# Patient Record
Sex: Female | Born: 1995 | Race: Black or African American | Hispanic: No | Marital: Single
Health system: Southern US, Community
[De-identification: ages and names within clinical notes are randomized; demographics above are authoritative.]

---

## 2017-02-15 ENCOUNTER — Encounter (HOSPITAL_COMMUNITY): Payer: Self-pay | Admitting: Emergency Medicine

## 2017-02-15 ENCOUNTER — Emergency Department (HOSPITAL_COMMUNITY)
Admission: EM | Admit: 2017-02-15 | Discharge: 2017-02-15 | Disposition: A | Discharge status: Home / Self Care | Payer: 59 | Attending: Emergency Medicine | Admitting: Emergency Medicine

## 2017-02-15 DIAGNOSIS — R6889 Other general symptoms and signs: Secondary | ICD-10-CM

## 2017-02-15 DIAGNOSIS — J111 Influenza due to unidentified influenza virus with other respiratory manifestations: Secondary | ICD-10-CM | POA: Diagnosis not present

## 2017-02-15 DIAGNOSIS — R509 Fever, unspecified: Secondary | ICD-10-CM | POA: Diagnosis present

## 2017-02-15 MED ORDER — ACETAMINOPHEN 325 MG PO TABS
650.0000 mg | ORAL_TABLET | Freq: Once | ORAL | Status: AC | PRN
  Administered 2017-02-15: 650 mg via ORAL
  Filled 2017-02-15: qty 2

## 2017-02-15 MED ORDER — IBUPROFEN 200 MG PO TABS
600.0000 mg | ORAL_TABLET | Freq: Once | ORAL | Status: AC
  Administered 2017-02-15: 600 mg via ORAL
  Filled 2017-02-15: qty 3

## 2017-02-15 NOTE — ED Triage Notes (Signed)
Per pt, states fever, chills since last night-has not taken any meds

## 2017-02-15 NOTE — ED Provider Notes (Signed)
[facility]-EMERGENCY DEPT Provider Note   CSN: [id number] Arrival date & time: 02/15/17  0908     History   Chief Complaint Chief Complaint  Patient presents with  . Fever    HPI Christy Peterson is a 21 y.o. female.  HPI   21 year old female presenting with what she describes as "flu" symptoms.  Onset yesterday.  Subjective fever.  Myalgias.  Headache.  Cough.  No sore throat.  No urinary complaints.  No vomiting or diarrhea.  No sick contacts.  She is otherwise healthy.  She has been taking Tylenol with mild improvement.  History reviewed. No pertinent past medical history.  There are no active problems to display for this patient.   History reviewed. No pertinent surgical history.  OB History    No data available       Home Medications    Prior to Admission medications   Not on File    Family History No family history on file.  Social History Social History   Tobacco Use  . Smoking status: Not on file  Substance Use Topics  . Alcohol use: No    Frequency: Never  . Drug use: No     Allergies   Patient has no allergy information on record.   Review of Systems Review of Systems  All systems reviewed and negative, other than as noted in HPI.  Physical Exam Updated Vital Signs BP 114/67 (BP Location: Right Arm)   Pulse 85   Temp (!) 101.2 F (38.4 C) (Oral)   Resp 16   LMP 01/12/2017   SpO2 98%   Physical Exam  Constitutional: She appears well-developed and well-nourished. No distress.  Laying in bed.  Somewhat tired appearing, but nontoxic.  HENT:  Head: Normocephalic and atraumatic.  Mouth/Throat: No oropharyngeal exudate.  TMs clear b/l.   Eyes: Conjunctivae are normal. Right eye exhibits no discharge. Left eye exhibits no discharge.  Neck: Neck supple.  No nuchal rigidity  Cardiovascular: Normal rate, regular rhythm and normal heart sounds. Exam reveals no gallop and no friction rub.  No murmur  heard. Pulmonary/Chest: Effort normal and breath sounds normal. No respiratory distress.  Abdominal: Soft. She exhibits no distension. There is no tenderness.  Musculoskeletal: She exhibits no edema or tenderness.  Neurological: She is alert.  Skin: Skin is warm and dry.  Psychiatric: She has a normal mood and affect. Her behavior is normal. Thought content normal.  Nursing note and vitals reviewed.    ED Treatments / Results  Labs (all labs ordered are listed, but only abnormal results are displayed) Labs Reviewed - No data to display  EKG  EKG Interpretation None       Radiology No results found.  Procedures Procedures (including critical care time)  Medications Ordered in ED Medications  ibuprofen (ADVIL,MOTRIN) tablet 600 mg (not administered)  acetaminophen (TYLENOL) tablet 650 mg (650 mg Oral Given 02/15/17 0929)     Initial Impression / Assessment and Plan / ED Course  I have reviewed the triage vital signs and the nursing notes.  Pertinent labs & imaging results that were available during my care of the patient were reviewed by me and considered in my medical decision making (see chart for details).     21 year old female with flulike symptoms.  Her exam is nonfocal.  She is nontoxic.  No clear indication to direct further testing at this time.  She is 20 and otherwise healthy.  Symptomatic treatment.  Return precautions were discussed.  Work note provided.  Final Clinical Impressions(s) / ED Diagnoses   Final diagnoses:  Flu-like symptoms    ED Discharge Orders    None       Raeford RazorKohut, Mico Spark, MD 02/15/17 [phone]

## 2019-11-19 ENCOUNTER — Emergency Department (HOSPITAL_COMMUNITY): Payer: No Typology Code available for payment source

## 2019-11-19 ENCOUNTER — Emergency Department (HOSPITAL_COMMUNITY)
Admission: EM | Admit: 2019-11-19 | Discharge: 2019-11-19 | Disposition: A | Discharge status: Home / Self Care | Payer: No Typology Code available for payment source | Attending: Emergency Medicine | Admitting: Emergency Medicine

## 2019-11-19 ENCOUNTER — Other Ambulatory Visit: Payer: Self-pay

## 2019-11-19 DIAGNOSIS — Y999 Unspecified external cause status: Secondary | ICD-10-CM | POA: Diagnosis not present

## 2019-11-19 DIAGNOSIS — S43401A Unspecified sprain of right shoulder joint, initial encounter: Secondary | ICD-10-CM

## 2019-11-19 DIAGNOSIS — Y939 Activity, unspecified: Secondary | ICD-10-CM | POA: Insufficient documentation

## 2019-11-19 DIAGNOSIS — W010XXA Fall on same level from slipping, tripping and stumbling without subsequent striking against object, initial encounter: Secondary | ICD-10-CM | POA: Insufficient documentation

## 2019-11-19 DIAGNOSIS — Y9289 Other specified places as the place of occurrence of the external cause: Secondary | ICD-10-CM | POA: Diagnosis not present

## 2019-11-19 DIAGNOSIS — S4991XA Unspecified injury of right shoulder and upper arm, initial encounter: Secondary | ICD-10-CM | POA: Insufficient documentation

## 2019-11-19 MED ORDER — TRAMADOL HCL 50 MG PO TABS: 50.0000 mg | ORAL_TABLET | Freq: Four times a day (QID) | ORAL | 0 refills | Status: DC | PRN

## 2019-11-19 MED ORDER — IBUPROFEN 200 MG PO TABS
600.0000 mg | ORAL_TABLET | Freq: Once | ORAL | Status: AC
  Administered 2019-11-19: 03:00:00 600 mg via ORAL
  Filled 2019-11-19: qty 3

## 2019-11-19 NOTE — ED Provider Notes (Signed)
[facility]-EMERGENCY DEPT Provider Note   CSN: [id number] Arrival date & time: 11/19/19  8657     History Chief Complaint  Patient presents with  . Shoulder Injury    Christy Peterson is a 24 y.o. female.  Patient is a 24 year old female with no significant past medical history.  She presents for evaluation of her right shoulder injury.  She tells me she was startled by a bug, then lost her balance and fell.  She is uncertain as to how she injured her shoulder during the fall, but describes pain to the entire deltoid area.  She denies any numbness or tingling.  She denies any other injury.  Her pain is worse with movement and there are no alleviating factors.  The history is provided by the patient.       No past medical history on file.  There are no problems to display for this patient.   No past surgical history on file.   OB History   No obstetric history on file.     No family history on file.  Social History   Tobacco Use  . Smoking status: Not on file  Substance Use Topics  . Alcohol use: No  . Drug use: No    Home Medications Prior to Admission medications   Not on File    Allergies    Patient has no allergy information on record.  Review of Systems   Review of Systems  All other systems reviewed and are negative.   Physical Exam Updated Vital Signs BP 109/83 (BP Location: Left Arm)   Pulse (!) 59   Temp 98.2 F (36.8 C) (Oral)   Resp 16   Ht 5\' 6"  (1.676 m)   Wt 63.5 kg   LMP 11/17/2019   SpO2 100%   BMI 22.60 kg/m   Physical Exam Vitals and nursing note reviewed.  Constitutional:      General: She is not in acute distress.    Appearance: Normal appearance. She is not ill-appearing.  HENT:     Head: Normocephalic and atraumatic.  Pulmonary:     Effort: Pulmonary effort is normal.  Musculoskeletal:     Comments: The right shoulder appears grossly normal.  There is no deformity or significant swelling.  There  is tenderness over the deltoid.  She has pain with range of motion.  Ulnar and radial pulses are easily palpable and motor and sensation are intact throughout the entire hand.  Skin:    General: Skin is warm and dry.  Neurological:     Mental Status: She is alert and oriented to person, place, and time.     ED Results / Procedures / Treatments   Labs (all labs ordered are listed, but only abnormal results are displayed) Labs Reviewed  TYPE AND SCREEN    EKG None  Radiology DG Shoulder Right  Result Date: 11/19/2019 CLINICAL DATA:  Fall EXAM: RIGHT SHOULDER - 2+ VIEW COMPARISON:  None. FINDINGS: There is no evidence of fracture or dislocation. There is no evidence of arthropathy or other focal bone abnormality. Soft tissues are unremarkable. IMPRESSION: Negative. Electronically Signed   By: 11/21/2019 M.D.   On: 11/19/2019 01:33    Procedures Procedures (including critical care time)  Medications Ordered in ED Medications - No data to display  ED Course  I have reviewed the triage vital signs and the nursing notes.  Pertinent labs & imaging results that were available during my care of the patient  were reviewed by me and considered in my medical decision making (see chart for details).    MDM Rules/Calculators/A&P  X-rays are negative for fracture.  This appears to be a sprain of the shoulder.  She will be placed in an arm sling, treated with NSAIDs and tramadol.  She is to ice her shoulder and follow-up as needed if not improving in the next 1 to 2 weeks.  Final Clinical Impression(s) / ED Diagnoses Final diagnoses:  None    Rx / DC Orders ED Discharge Orders    None       Geoffery Lyons, MD 11/19/19 [phone]

## 2019-11-19 NOTE — ED Triage Notes (Signed)
Arrived POV from home. Patient reports she fell on right shoulder while running from a bug.

## 2019-11-19 NOTE — Discharge Instructions (Addendum)
Wear arm sling for the next several days.  Ice for 20 minutes every 2 hours while awake for the next 2 days.  Take ibuprofen 600 mg every 6 hours as needed for pain.    Follow-up with your primary doctor if not improving in the next week, and return to the ER if symptoms significantly worsen or change.

## 2021-04-29 IMAGING — CR DG SHOULDER 2+V*R*
3 series · 3 of 3 positions shown · non-contrast
Comparison: None.

CLINICAL DATA: Fall

EXAM:
RIGHT SHOULDER - 2+ VIEW

[w shoulder external right]
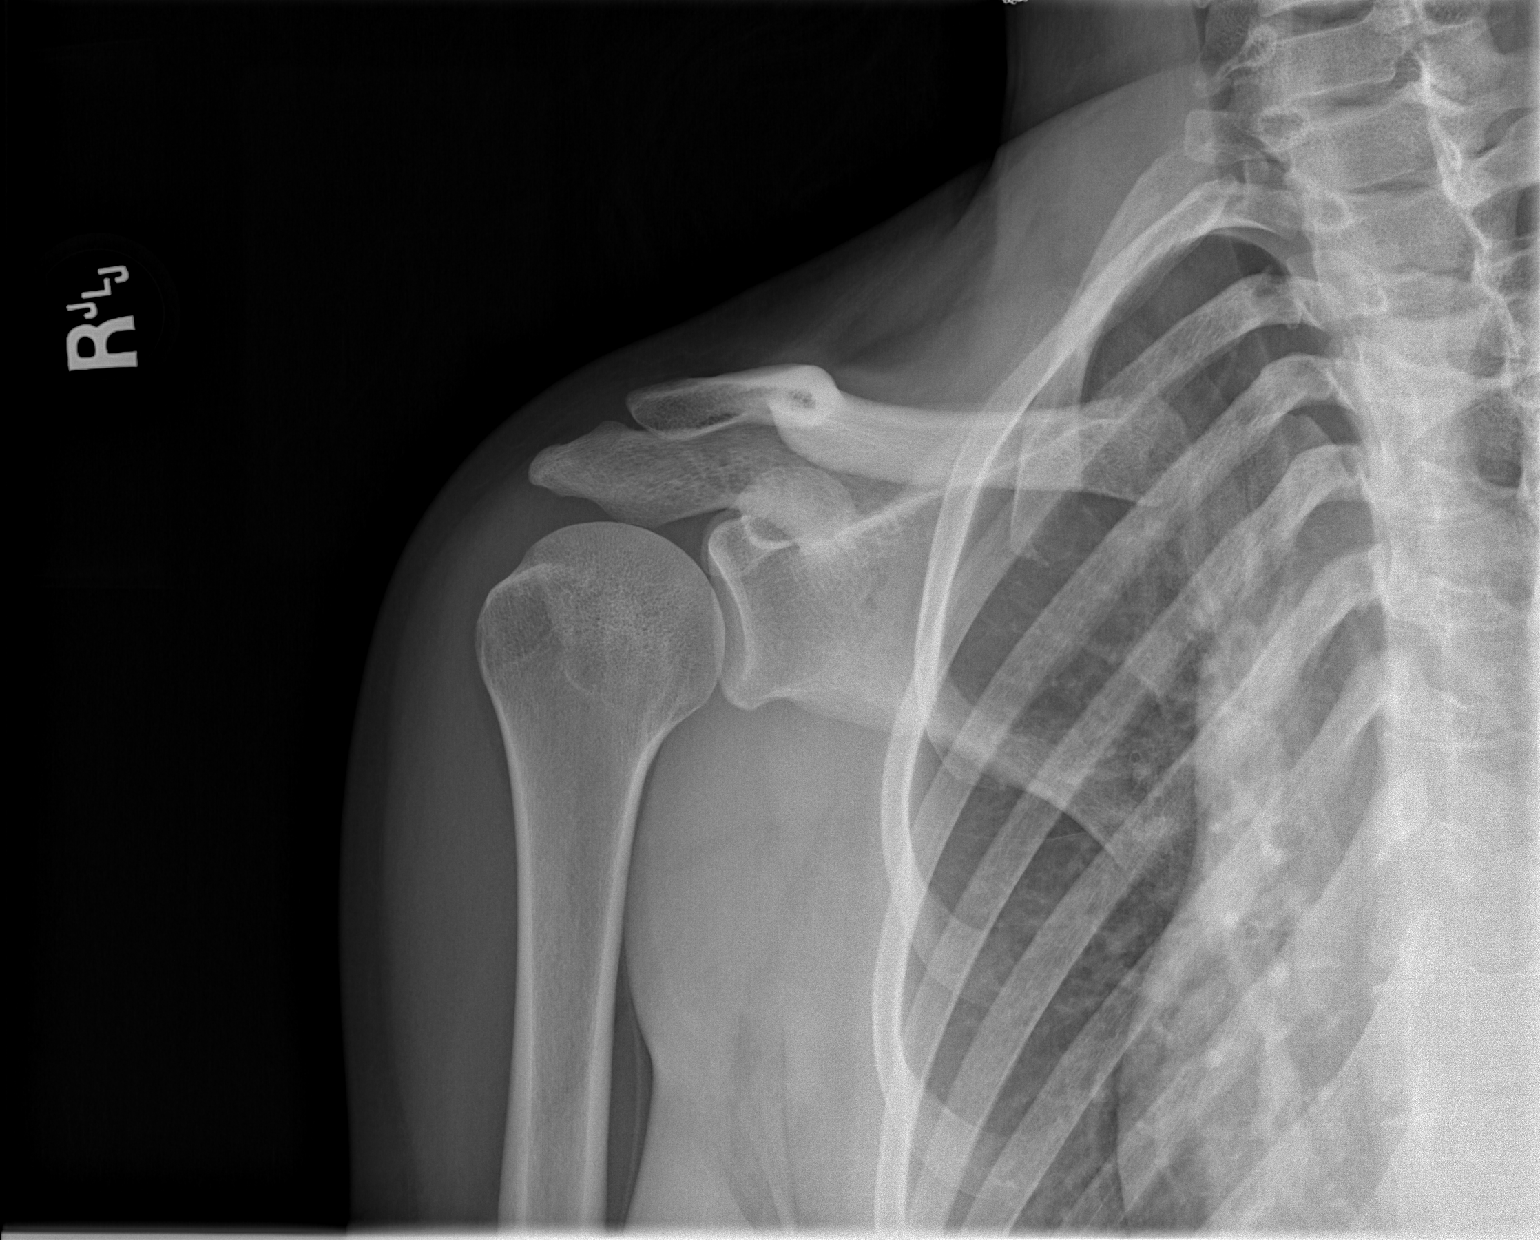

[w shoulder y-view right]
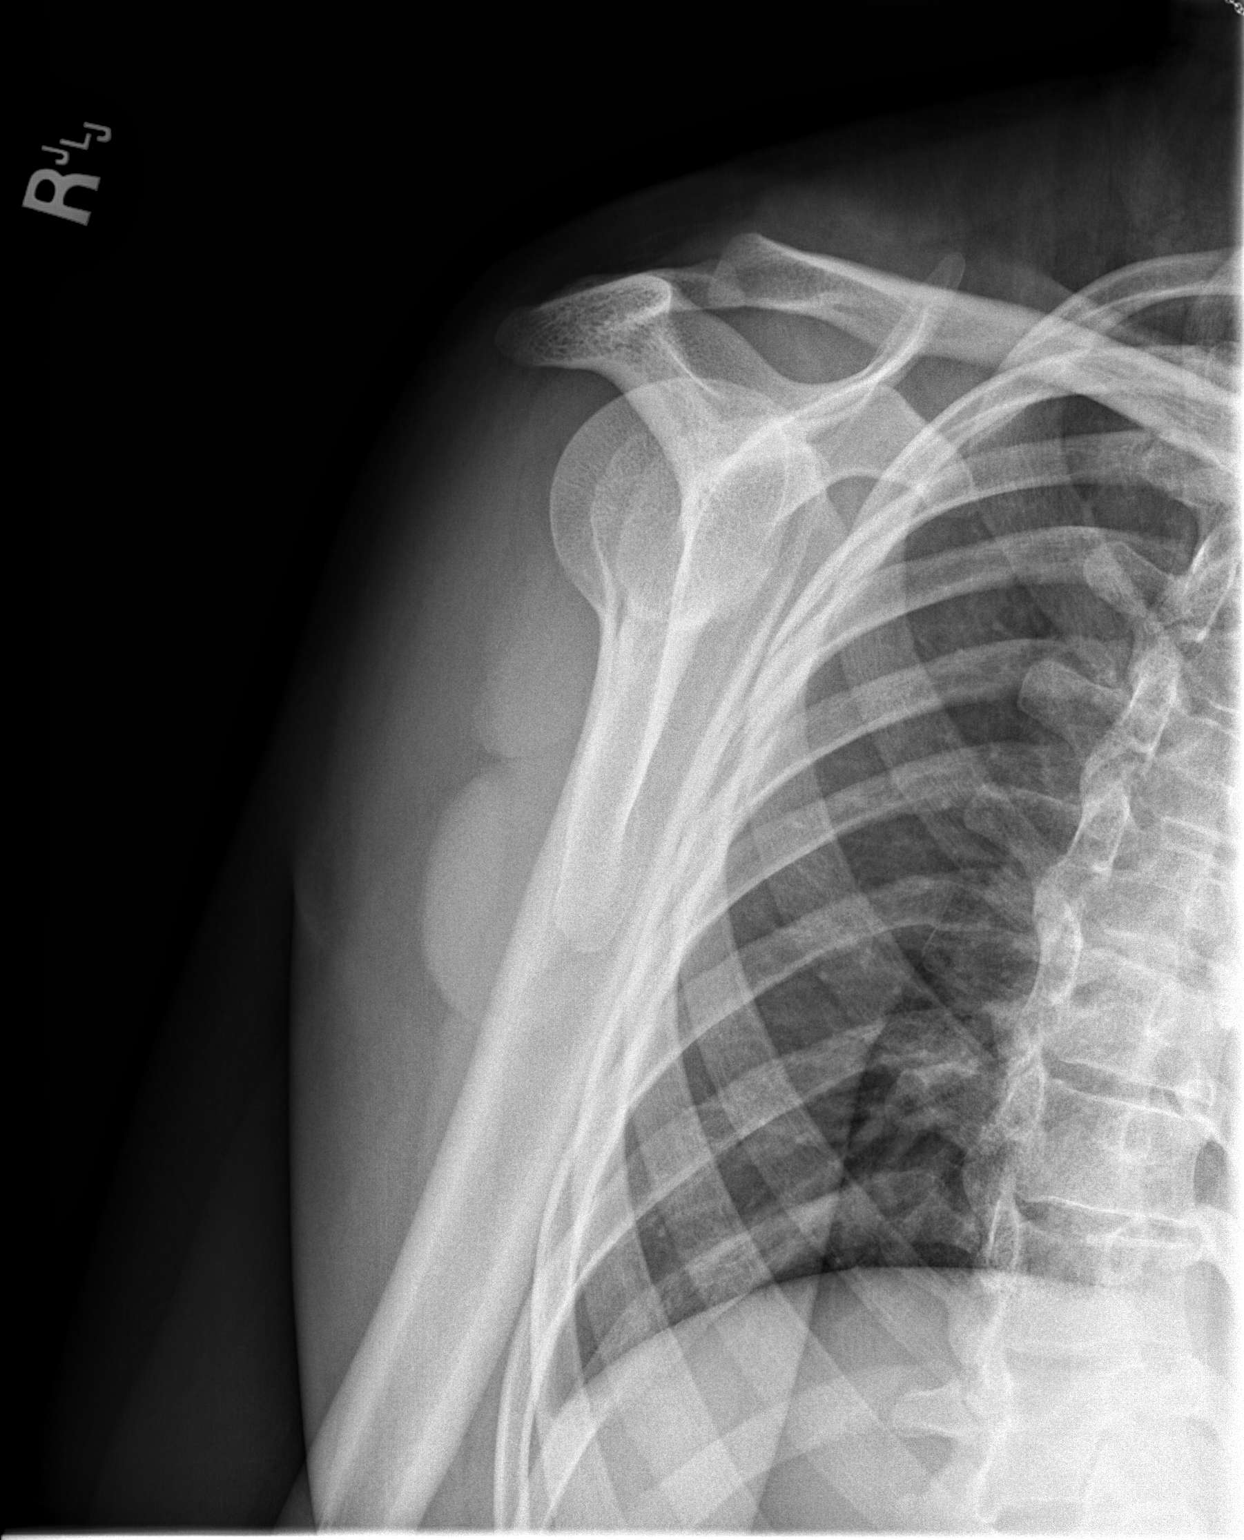

[x shoulder axillary right]
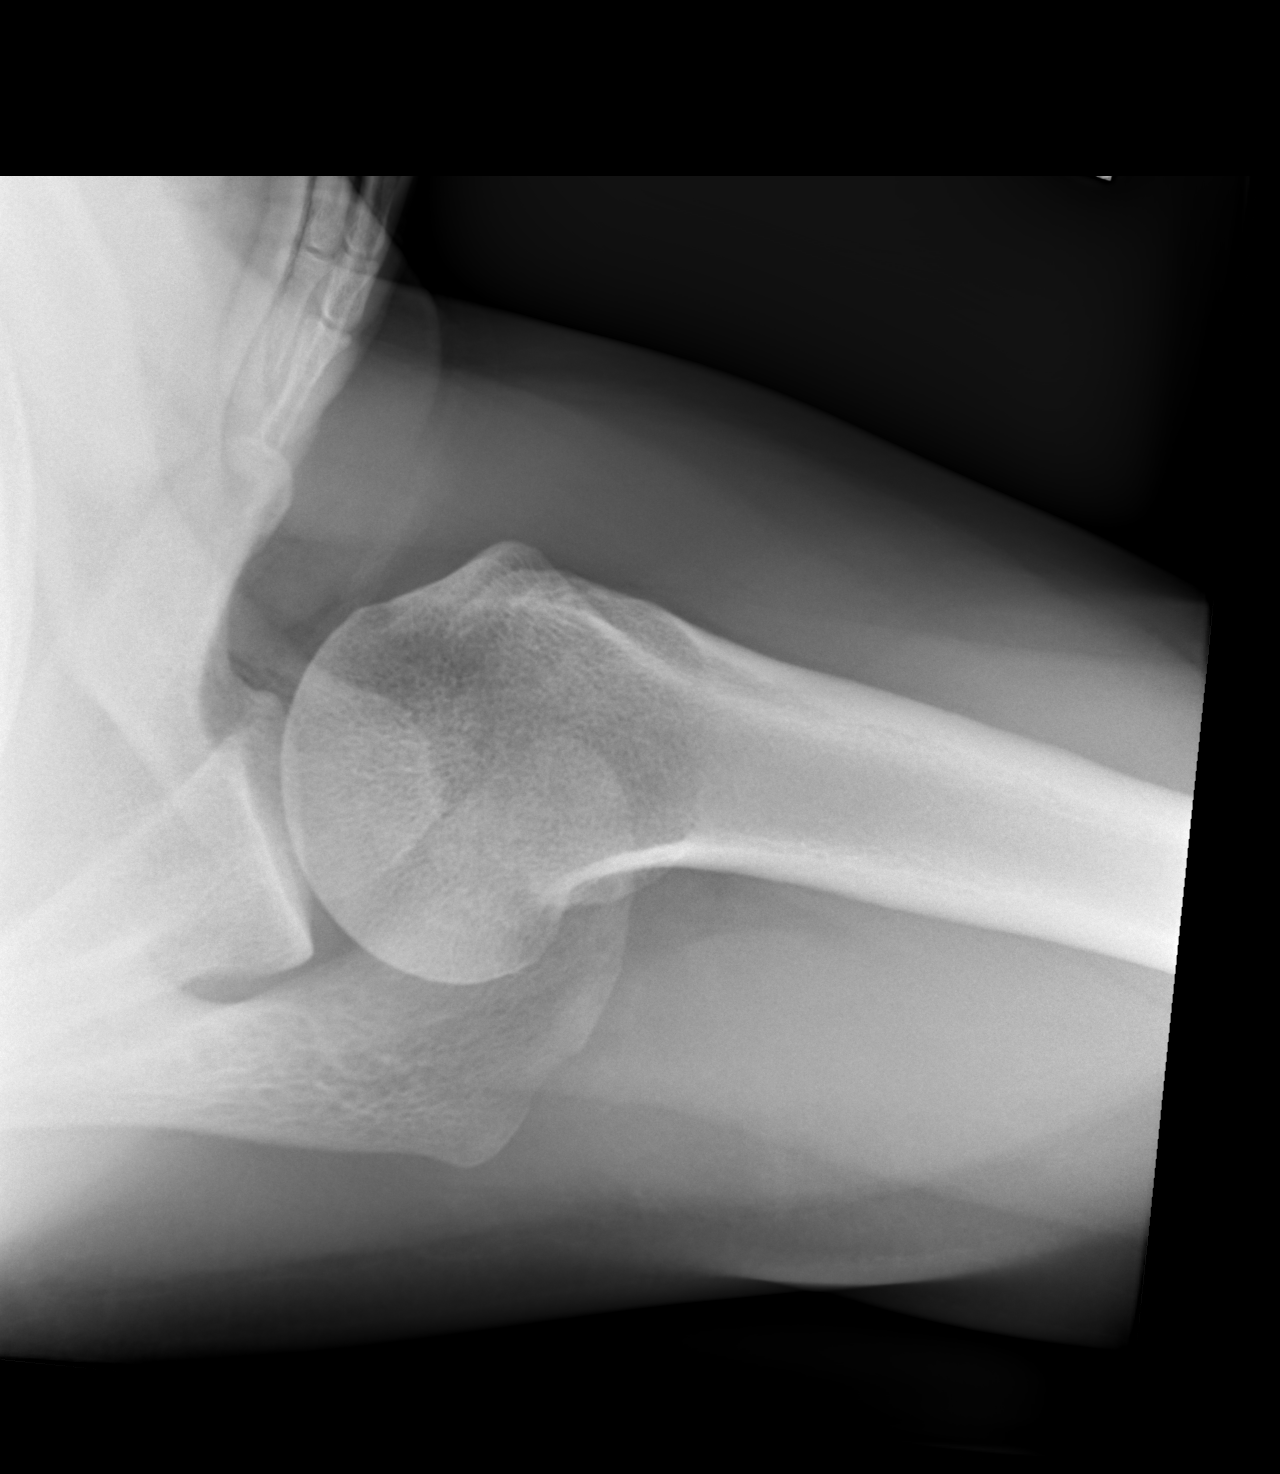

[3 of 3 positions shown; findings below may reference images not displayed]

FINDINGS: There is no evidence of fracture or dislocation. There is no
evidence of arthropathy or other focal bone abnormality. Soft
tissues are unremarkable.
IMPRESSION: Negative.
